# Patient Record
Sex: Male | Born: 2005 | Race: Black or African American | Hispanic: No | Marital: Single | Smoking: Never smoker
Health system: Southern US, Community
[De-identification: ages and names within clinical notes are randomized; demographics above are authoritative.]

## PROBLEM LIST (undated history)

## (undated) DIAGNOSIS — R51 Headache: Secondary | ICD-10-CM

## (undated) DIAGNOSIS — Z9109 Other allergy status, other than to drugs and biological substances: Secondary | ICD-10-CM

## (undated) DIAGNOSIS — J45909 Unspecified asthma, uncomplicated: Secondary | ICD-10-CM

## (undated) DIAGNOSIS — G43009 Migraine without aura, not intractable, without status migrainosus: Secondary | ICD-10-CM

## (undated) HISTORY — DX: Migraine without aura, not intractable, without status migrainosus: G43.009

## (undated) HISTORY — DX: Unspecified asthma, uncomplicated: J45.909

## (undated) HISTORY — DX: Headache: R51

---
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
## 2023-01-05 ENCOUNTER — Ambulatory Visit (HOSPITAL_BASED_OUTPATIENT_CLINIC_OR_DEPARTMENT_OTHER): Payer: Federal, State, Local not specified - PPO | Admitting: Pulmonary Disease

## 2023-01-05 ENCOUNTER — Encounter (HOSPITAL_BASED_OUTPATIENT_CLINIC_OR_DEPARTMENT_OTHER): Payer: Self-pay | Admitting: Pulmonary Disease

## 2023-01-05 VITALS — BP 122/70 | HR 56 | Resp 16 | Ht 77.0 in | Wt 174.6 lb

## 2023-01-05 DIAGNOSIS — J9383 Other pneumothorax: Secondary | ICD-10-CM | POA: Diagnosis not present

## 2023-01-05 NOTE — Progress Notes (Unsigned)
   Subjective:    Patient ID: Bradley Valdez, male    DOB: [birth year], 17 y.o.   MRN: [id number]  HPI  17yo male with h/o asthma who presented to Urgent Care 11/2022 with chest pain with inspiration, found to have a 5% PTX (spontaneous vs. Traumatic). Asymptomatic until just prior to scheduled appointment with new PCP on 7/17. Repeat CXR with moderate L PTX with signs of tension, had chest tube placed with near complete resolution. Pulmonology consulted on 7/19   Review of Systems     Objective:   Physical Exam        Assessment & Plan:

## 2023-01-05 NOTE — Assessment & Plan Note (Signed)
x

## 2023-01-05 NOTE — Patient Instructions (Signed)
CT chest wo con  Good luck with school

## 2023-01-12 ENCOUNTER — Ambulatory Visit
Admission: RE | Admit: 2023-01-12 | Discharge: 2023-01-12 | Disposition: A | Discharge status: Home / Self Care | Payer: Federal, State, Local not specified - PPO | Source: Ambulatory Visit | Attending: Pulmonary Disease | Admitting: Pulmonary Disease

## 2023-01-12 ENCOUNTER — Inpatient Hospital Stay: Payer: Federal, State, Local not specified - PPO | Admitting: Pulmonary Disease

## 2023-01-12 DIAGNOSIS — J939 Pneumothorax, unspecified: Secondary | ICD-10-CM | POA: Diagnosis not present

## 2023-01-12 DIAGNOSIS — J9383 Other pneumothorax: Secondary | ICD-10-CM

## 2023-01-12 DIAGNOSIS — Z09 Encounter for follow-up examination after completed treatment for conditions other than malignant neoplasm: Secondary | ICD-10-CM | POA: Diagnosis not present

## 2023-01-13 ENCOUNTER — Encounter (HOSPITAL_BASED_OUTPATIENT_CLINIC_OR_DEPARTMENT_OTHER): Payer: Self-pay | Admitting: Pulmonary Disease

## 2023-01-22 ENCOUNTER — Other Ambulatory Visit: Payer: Federal, State, Local not specified - PPO

## 2023-03-10 ENCOUNTER — Telehealth: Payer: Self-pay | Admitting: Pulmonary Disease

## 2023-03-10 NOTE — Telephone Encounter (Signed)
Received page from after-hours phone line.  Connected with patient's mother.  Patient has no spontaneous pneumothorax with a traumatic pneumothorax 12/2022.  Sudden onset of chest pain positional in nature today.  Advised to go to the ED immediately.  To rule out pneumothorax and further imaging to evaluate other causes.  He is currently in [place].  I recommend he go whenever emergency room is closest to him.  Mother expressed understanding and will help coordinate with the son.

## 2023-03-11 ENCOUNTER — Telehealth: Payer: Self-pay | Admitting: Pulmonary Disease

## 2023-03-11 NOTE — Telephone Encounter (Signed)
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  Had a recent CT which was negative.  Received a call from the patient's mother who reported that the patient had a repeat spontaneous pneumothorax which was described as "large "in the emergency department and a chest tube had to be placed.  She is wondering what the next steps would be including pleurodesis, repeat chest tube, and whether these procedures had to be done in [place] where the patient is currently residing or whether these would be better performed at [facility].  I talked through minute spontaneous pneumothoraxes, likely treatment plan, but advised her that local experts would be able to best advise her about pleurodesis versus chest tube explantation as the next step as there can be able to analyze his films and progress.  She felt that her questions were adequately answered and will check in if additional questions arise.  If possible, they will attempt to transfer from the freestanding emergency department to [facility].  Depending on clinical course, may need posthospital follow-up, but no additional workup/appointment is necessary at this time.

## 2023-03-16 ENCOUNTER — Telehealth: Payer: Self-pay

## 2023-03-16 NOTE — Transitions of Care (Post Inpatient/ED Visit) (Signed)
   03/16/2023  Name: Bradley Valdez MRN: [id number] DOB: [birth year]  Today's TOC FU Call Status: Today's TOC FU Call Status:: Unsuccessful Call (1st Attempt) Unsuccessful Call (1st Attempt) Date: 03/16/23  Attempted to reach the patient regarding the most recent Inpatient/ED visit.  Follow Up Plan: Additional outreach attempts will be made to reach the patient to complete the Transitions of Care (Post Inpatient/ED visit) call.     Antionette Fairy, RN,BSN,CCM RN Care Manager Transitions of Care  [place]  Direct Phone: [phone] Toll Free: [phone] Fax: [phone]

## 2023-03-19 ENCOUNTER — Telehealth: Payer: Self-pay

## 2023-03-19 DIAGNOSIS — S2190XA Unspecified open wound of unspecified part of thorax, initial encounter: Secondary | ICD-10-CM | POA: Diagnosis not present

## 2023-03-19 NOTE — Transitions of Care (Post Inpatient/ED Visit) (Signed)
   03/19/2023  Name: Bradley Valdez MRN: [id number] DOB: 15-Jan-2006  Today's TOC FU Call Status: Today's TOC FU Call Status:: Unsuccessful Call (2nd Attempt) Unsuccessful Call (2nd Attempt) Date: 03/19/23  Attempted to reach the patient regarding the most recent Inpatient/ED visit.  Follow Up Plan: Additional outreach attempts will be made to reach the patient to complete the Transitions of Care (Post Inpatient/ED visit) call.      Antionette Fairy, RN,BSN,CCM RN Care Manager Transitions of Care  [place]  Direct Phone: [phone] Toll Free: [phone] Fax: [phone]

## 2023-03-19 NOTE — Transitions of Care (Post Inpatient/ED Visit) (Signed)
   03/19/2023  Name: LENNELL SHANKS II MRN: [id number] DOB: 2005/09/07  Today's TOC FU Call Status: Today's TOC FU Call Status:: Unsuccessful Call (3rd Attempt) Unsuccessful Call (3rd Attempt) Date: 03/19/23  Attempted to reach the patient regarding the most recent Inpatient/ED visit.  Follow Up Plan: No further outreach attempts will be made at this time. We have been unable to contact the patient.    Antionette Fairy, RN,BSN,CCM RN Care Manager Transitions of Care  [place]  Direct Phone: [phone] Toll Free: [phone] Fax: [phone]

## 2023-05-02 DIAGNOSIS — K080 Exfoliation of teeth due to systemic causes: Secondary | ICD-10-CM | POA: Diagnosis not present

## 2023-05-28 DIAGNOSIS — A159 Respiratory tuberculosis unspecified: Secondary | ICD-10-CM

## 2023-10-03 ENCOUNTER — Encounter (HOSPITAL_BASED_OUTPATIENT_CLINIC_OR_DEPARTMENT_OTHER): Payer: Self-pay | Admitting: *Deleted
